# Patient Record
Sex: Female | Born: 1990 | Race: Black or African American | Hispanic: No | Marital: Single | Smoking: Former smoker
Health system: Southern US, Community
[De-identification: ages and names within clinical notes are randomized; demographics above are authoritative.]

## PROBLEM LIST (undated history)

## (undated) ENCOUNTER — Inpatient Hospital Stay (HOSPITAL_COMMUNITY): Payer: Self-pay

## (undated) DIAGNOSIS — Z789 Other specified health status: Secondary | ICD-10-CM

## (undated) HISTORY — PX: NO PAST SURGERIES: SHX2092

---
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
## 2011-02-17 LAB — URINALYSIS, ROUTINE W REFLEX MICROSCOPIC
Bilirubin Urine: NEGATIVE
Glucose, UA: NEGATIVE mg/dL
Hgb urine dipstick: NEGATIVE
Ketones, ur: NEGATIVE mg/dL
Nitrite: NEGATIVE
Protein, ur: NEGATIVE mg/dL
Specific Gravity, Urine: 1.018 (ref 1.005–1.030)
Urobilinogen, UA: 1 mg/dL (ref 0.0–1.0)
pH: 7.5 (ref 5.0–8.0)

## 2011-02-17 LAB — RPR: RPR Ser Ql: NONREACTIVE

## 2011-02-17 LAB — URINE MICROSCOPIC-ADD ON

## 2011-02-17 LAB — PREGNANCY, URINE: Preg Test, Ur: NEGATIVE

## 2011-02-17 LAB — HIV ANTIBODY (ROUTINE TESTING W REFLEX): HIV: NONREACTIVE

## 2011-02-21 LAB — URINALYSIS, ROUTINE W REFLEX MICROSCOPIC
Ketones, ur: NEGATIVE mg/dL
Nitrite: NEGATIVE
Protein, ur: NEGATIVE mg/dL
pH: 6 (ref 5.0–8.0)

## 2011-02-21 LAB — POCT PREGNANCY, URINE: Preg Test, Ur: NEGATIVE

## 2011-02-21 LAB — WET PREP, GENITAL
Trich, Wet Prep: NONE SEEN
Yeast Wet Prep HPF POC: NONE SEEN

## 2011-02-21 LAB — URINE MICROSCOPIC-ADD ON

## 2011-08-09 LAB — URINALYSIS, ROUTINE W REFLEX MICROSCOPIC
Ketones, ur: NEGATIVE
Nitrite: NEGATIVE
Protein, ur: NEGATIVE
Urobilinogen, UA: 0.2

## 2011-08-09 LAB — GC/CHLAMYDIA PROBE AMP, GENITAL
Chlamydia, DNA Probe: POSITIVE — AB
GC Probe Amp, Genital: NEGATIVE

## 2011-08-09 LAB — WET PREP, GENITAL
Trich, Wet Prep: NONE SEEN
Yeast Wet Prep HPF POC: NONE SEEN

## 2011-08-09 LAB — URINE MICROSCOPIC-ADD ON

## 2011-08-09 LAB — POCT PREGNANCY, URINE: Operator id: 29452

## 2011-08-22 LAB — DIFFERENTIAL
Basophils Relative: 0
Eosinophils Absolute: 0.1 — ABNORMAL LOW
Lymphs Abs: 1.7
Monocytes Absolute: 0.7
Neutro Abs: 7.2

## 2011-08-22 LAB — RAPID URINE DRUG SCREEN, HOSP PERFORMED
Amphetamines: NOT DETECTED
Barbiturates: NOT DETECTED
Opiates: NOT DETECTED

## 2011-08-22 LAB — BASIC METABOLIC PANEL
CO2: 26
Calcium: 8.9
Glucose, Bld: 91
Potassium: 4.2
Sodium: 140

## 2011-08-22 LAB — CBC
HCT: 36.2
Hemoglobin: 11.6 — ABNORMAL LOW
MCHC: 32.1
MCV: 69.7 — ABNORMAL LOW
RDW: 29.2 — ABNORMAL HIGH

## 2011-08-22 LAB — URINALYSIS, ROUTINE W REFLEX MICROSCOPIC
Glucose, UA: NEGATIVE
Ketones, ur: NEGATIVE
Leukocytes, UA: NEGATIVE
pH: 5.5

## 2015-06-08 ENCOUNTER — Encounter (HOSPITAL_COMMUNITY): Payer: Self-pay | Admitting: *Deleted

## 2015-06-08 ENCOUNTER — Inpatient Hospital Stay (HOSPITAL_COMMUNITY)
Admission: AD | Admit: 2015-06-08 | Discharge: 2015-06-08 | Disposition: A | Discharge status: Home / Self Care | Payer: Medicaid - Out of State | Source: Ambulatory Visit | Attending: Obstetrics and Gynecology | Admitting: Obstetrics and Gynecology

## 2015-06-08 DIAGNOSIS — Z3A38 38 weeks gestation of pregnancy: Secondary | ICD-10-CM | POA: Insufficient documentation

## 2015-06-08 DIAGNOSIS — Z3490 Encounter for supervision of normal pregnancy, unspecified, unspecified trimester: Secondary | ICD-10-CM

## 2015-06-08 LAB — DIFFERENTIAL
BASOS ABS: 0 10*3/uL (ref 0.0–0.1)
Basophils Relative: 0 % (ref 0–1)
EOS ABS: 0.1 10*3/uL (ref 0.0–0.7)
Eosinophils Relative: 1 % (ref 0–5)
LYMPHS ABS: 1.4 10*3/uL (ref 0.7–4.0)
Lymphocytes Relative: 17 % (ref 12–46)
Monocytes Absolute: 0.8 10*3/uL (ref 0.1–1.0)
Monocytes Relative: 9 % (ref 3–12)
Neutro Abs: 5.9 10*3/uL (ref 1.7–7.7)
Neutrophils Relative %: 73 % (ref 43–77)

## 2015-06-08 LAB — CBC
HEMATOCRIT: 35.4 % — AB (ref 36.0–46.0)
HEMOGLOBIN: 11.7 g/dL — AB (ref 12.0–15.0)
MCH: 26.5 pg (ref 26.0–34.0)
MCHC: 33.1 g/dL (ref 30.0–36.0)
MCV: 80.3 fL (ref 78.0–100.0)
Platelets: 164 10*3/uL (ref 150–400)
RBC: 4.41 MIL/uL (ref 3.87–5.11)
RDW: 13.9 % (ref 11.5–15.5)
WBC: 8.2 10*3/uL (ref 4.0–10.5)

## 2015-06-08 LAB — HEPATITIS B SURFACE ANTIGEN: Hepatitis B Surface Ag: NEGATIVE

## 2015-06-08 LAB — RAPID HIV SCREEN (HIV 1/2 AB+AG)
HIV 1/2 Antibodies: NONREACTIVE
HIV-1 P24 Antigen - HIV24: NONREACTIVE

## 2015-06-08 LAB — ABO/RH: ABO/RH(D): AB POS

## 2015-06-08 LAB — TYPE AND SCREEN
ABO/RH(D): AB POS
Antibody Screen: NEGATIVE

## 2015-06-08 NOTE — MAU Note (Signed)
Urine in lab 

## 2015-06-08 NOTE — MAU Provider Note (Signed)
Pt here for labor check 2/2 to irregular contractions and perineal pressure. Not in active labor. PNC received in [facility]; needing to est care in [facility]: Lambert Rhonda W. Appt made for 0940 on 7/27 and pt will bring all records from [facility]. Pt prenatal labs drawn for visit in am.  D. Lowanda Foster, MD  RN labor check. I agree with above. NST reactive.   [place], CNM 06/08/2015 2:28 PM

## 2015-06-08 NOTE — MAU Note (Signed)
Pain started in lower abd last night.  Comes and goes, is not regular.

## 2015-06-08 NOTE — MAU Note (Signed)
Pain started in lower abd last night.Comes and goes, irregular  Denies bright red vaginal bleeding.   Positive fetal movement Denies SROM/LOF Denies any infections/complications of pregnancy  GBS unknown per patient  Has prenatal records from [place] in [place] with her, but not with her today.

## 2015-06-08 NOTE — MAU Note (Signed)
Just moved here from [place].  Was getting care there. Has copy of records, but not with her

## 2015-06-08 NOTE — Discharge Instructions (Signed)
Braxton Hicks Contractions °Contractions of the uterus can occur throughout pregnancy. Contractions are not always a sign that you are in labor.  °WHAT ARE BRAXTON HICKS CONTRACTIONS?  °Contractions that occur before labor are called Braxton Hicks contractions, or false labor. Toward the end of pregnancy (32-34 weeks), these contractions can develop more often and may become more forceful. This is not true labor because these contractions do not result in opening (dilatation) and thinning of the cervix. They are sometimes difficult to tell apart from true labor because these contractions can be forceful and people have different pain tolerances. You should not feel embarrassed if you go to the hospital with false labor. Sometimes, the only way to tell if you are in true labor is for your health care provider to look for changes in the cervix. °If there are no prenatal problems or other health problems associated with the pregnancy, it is completely safe to be sent home with false labor and await the onset of true labor. °HOW CAN YOU TELL THE DIFFERENCE BETWEEN TRUE AND FALSE LABOR? °False Labor °· The contractions of false labor are usually shorter and not as hard as those of true labor.   °· The contractions are usually irregular.   °· The contractions are often felt in the front of the lower abdomen and in the groin.   °· The contractions may go away when you walk around or change positions while lying down.   °· The contractions get weaker and are shorter lasting as time goes on.   °· The contractions do not usually become progressively stronger, regular, and closer together as with true labor.   °True Labor °· Contractions in true labor last 30-70 seconds, become very regular, usually become more intense, and increase in frequency.   °· The contractions do not go away with walking.   °· The discomfort is usually felt in the top of the uterus and spreads to the lower abdomen and low back.   °· True labor can be  determined by your health care provider with an exam. This will show that the cervix is dilating and getting thinner.   °WHAT TO REMEMBER °· Keep up with your usual exercises and follow other instructions given by your health care provider.   °· Take medicines as directed by your health care provider.   °· Keep your regular prenatal appointments.   °· Eat and drink lightly if you think you are going into labor.   °· If Braxton Hicks contractions are making you uncomfortable:   °¨ Change your position from lying down or resting to walking, or from walking to resting.   °¨ Sit and rest in a tub of warm water.   °¨ Drink 2-3 glasses of water. Dehydration may cause these contractions.   °¨ Do slow and deep breathing several times an hour.   °WHEN SHOULD I SEEK IMMEDIATE MEDICAL CARE? °Seek immediate medical care if: °· Your contractions become stronger, more regular, and closer together.   °· You have fluid leaking or gushing from your vagina.   °· You have a fever.   °· You pass blood-tinged mucus.   °· You have vaginal bleeding.   °· You have continuous abdominal pain.   °· You have low back pain that you never had before.   °· You feel your baby's head pushing down and causing pelvic pressure.   °· Your baby is not moving as much as it used to.   °Document Released: 10/30/2005 Document Revised: 11/04/2013 Document Reviewed: 08/11/2013 °ExitCare® Patient Information ©2015 [employer]. This information is not intended to replace advice given to you by your health care   provider. Make sure you discuss any questions you have with your health care provider. ° °

## 2015-06-09 ENCOUNTER — Encounter: Payer: Medicaid - Out of State | Admitting: Certified Nurse Midwife

## 2015-06-09 LAB — HIV ANTIBODY (ROUTINE TESTING W REFLEX): HIV Screen 4th Generation wRfx: NONREACTIVE

## 2015-06-09 LAB — RPR: RPR Ser Ql: NONREACTIVE

## 2015-06-09 LAB — RUBELLA SCREEN: RUBELLA: 5 {index} (ref >0.99)

## 2015-06-12 LAB — CULTURE, BETA STREP (GROUP B ONLY)

## 2016-04-12 ENCOUNTER — Encounter (HOSPITAL_COMMUNITY): Payer: Self-pay | Admitting: *Deleted

## 2016-10-21 ENCOUNTER — Encounter (HOSPITAL_COMMUNITY): Payer: Self-pay | Admitting: *Deleted

## 2016-10-21 ENCOUNTER — Inpatient Hospital Stay (HOSPITAL_COMMUNITY)
Admission: AD | Admit: 2016-10-21 | Discharge: 2016-10-21 | Disposition: A | Discharge status: Home / Self Care | Payer: Medicaid Other | Source: Ambulatory Visit | Attending: Obstetrics & Gynecology | Admitting: Obstetrics & Gynecology

## 2016-10-21 DIAGNOSIS — Z3490 Encounter for supervision of normal pregnancy, unspecified, unspecified trimester: Secondary | ICD-10-CM | POA: Insufficient documentation

## 2016-10-21 DIAGNOSIS — Z3A00 Weeks of gestation of pregnancy not specified: Secondary | ICD-10-CM | POA: Insufficient documentation

## 2016-10-21 HISTORY — DX: Other specified health status: Z78.9

## 2016-10-21 NOTE — Discharge Instructions (Signed)

## 2016-10-21 NOTE — MAU Note (Signed)
C/o ucs all day yesterday and all nigh and today; ?SROM 2 days ago; denies any vaginal bleeding;

## 2016-10-21 NOTE — MAU Note (Signed)
States that she got [place] prenatal [place] in [place]; does not have any prenatal records with her; states that she thinks she should be induced and she wants an u/s;

## 2016-10-29 ENCOUNTER — Inpatient Hospital Stay (HOSPITAL_COMMUNITY): Payer: Medicaid Other

## 2016-10-29 ENCOUNTER — Encounter (HOSPITAL_COMMUNITY): Payer: Self-pay | Admitting: Certified Nurse Midwife

## 2016-10-29 ENCOUNTER — Inpatient Hospital Stay (HOSPITAL_COMMUNITY)
Admission: AD | Admit: 2016-10-29 | Discharge: 2016-10-29 | Disposition: A | Discharge status: Home / Self Care | Payer: Medicaid Other | Source: Ambulatory Visit | Attending: Obstetrics and Gynecology | Admitting: Obstetrics and Gynecology

## 2016-10-29 DIAGNOSIS — Z3A36 36 weeks gestation of pregnancy: Secondary | ICD-10-CM | POA: Insufficient documentation

## 2016-10-29 DIAGNOSIS — Z363 Encounter for antenatal screening for malformations: Secondary | ICD-10-CM | POA: Diagnosis present

## 2016-10-29 DIAGNOSIS — Z87891 Personal history of nicotine dependence: Secondary | ICD-10-CM | POA: Insufficient documentation

## 2016-10-29 DIAGNOSIS — O479 False labor, unspecified: Secondary | ICD-10-CM | POA: Diagnosis present

## 2016-10-29 DIAGNOSIS — O0930 Supervision of pregnancy with insufficient antenatal care, unspecified trimester: Secondary | ICD-10-CM

## 2016-10-29 DIAGNOSIS — O0933 Supervision of pregnancy with insufficient antenatal care, third trimester: Secondary | ICD-10-CM | POA: Insufficient documentation

## 2016-10-29 DIAGNOSIS — Z3687 Encounter for antenatal screening for uncertain dates: Secondary | ICD-10-CM

## 2016-10-29 LAB — URINALYSIS, ROUTINE W REFLEX MICROSCOPIC
Bacteria, UA: NONE SEEN
Bilirubin Urine: NEGATIVE
GLUCOSE, UA: NEGATIVE mg/dL
Hgb urine dipstick: NEGATIVE
Ketones, ur: NEGATIVE mg/dL
Nitrite: NEGATIVE
PROTEIN: NEGATIVE mg/dL
SPECIFIC GRAVITY, URINE: 1.01 (ref 1.005–1.030)
pH: 7 (ref 5.0–8.0)

## 2016-10-29 LAB — RAPID URINE DRUG SCREEN, HOSP PERFORMED
AMPHETAMINES: NOT DETECTED
BENZODIAZEPINES: NOT DETECTED
Barbiturates: NOT DETECTED
Cocaine: NOT DETECTED
Opiates: NOT DETECTED
Tetrahydrocannabinol: NOT DETECTED

## 2016-10-29 MED ORDER — ZOLPIDEM TARTRATE 5 MG PO TABS
5.0000 mg | ORAL_TABLET | Freq: Once | ORAL | Status: AC
  Administered 2016-10-29: 5 mg via ORAL
  Filled 2016-10-29: qty 1

## 2016-10-29 MED ORDER — OXYCODONE-ACETAMINOPHEN 5-325 MG PO TABS
1.0000 | ORAL_TABLET | Freq: Once | ORAL | Status: AC
  Administered 2016-10-29: 1 via ORAL
  Filled 2016-10-29: qty 1

## 2016-10-29 NOTE — MAU Note (Signed)
Patient presents to MAU via EMS with complaints of contractions. Patient states contractions began yesterday and have gotten worse since then. She states contractions are every 2-3 minutes. Denies bleeding or leaking of fluid

## 2016-10-29 NOTE — Discharge Instructions (Signed)
Braxton Hicks Contractions °Contractions of the uterus can occur throughout pregnancy. Contractions are not always a sign that you are in labor.  °WHAT ARE BRAXTON HICKS CONTRACTIONS?  °Contractions that occur before labor are called Braxton Hicks contractions, or false labor. Toward the end of pregnancy (32-34 weeks), these contractions can develop more often and may become more forceful. This is not true labor because these contractions do not result in opening (dilatation) and thinning of the cervix. They are sometimes difficult to tell apart from true labor because these contractions can be forceful and people have different pain tolerances. You should not feel embarrassed if you go to the hospital with false labor. Sometimes, the only way to tell if you are in true labor is for your health care provider to look for changes in the cervix. °If there are no prenatal problems or other health problems associated with the pregnancy, it is completely safe to be sent home with false labor and await the onset of true labor. °HOW CAN YOU TELL THE DIFFERENCE BETWEEN TRUE AND FALSE LABOR? °False Labor  °· The contractions of false labor are usually shorter and not as hard as those of true labor.   °· The contractions are usually irregular.   °· The contractions are often felt in the front of the lower abdomen and in the groin.   °· The contractions may go away when you walk around or change positions while lying down.   °· The contractions get weaker and are shorter lasting as time goes on.   °· The contractions do not usually become progressively stronger, regular, and closer together as with true labor.   °True Labor  °· Contractions in true labor last 30-70 seconds, become very regular, usually become more intense, and increase in frequency.   °· The contractions do not go away with walking.   °· The discomfort is usually felt in the top of the uterus and spreads to the lower abdomen and low back.   °· True labor can be  determined by your health care provider with an exam. This will show that the cervix is dilating and getting thinner.   °WHAT TO REMEMBER °· Keep up with your usual exercises and follow other instructions given by your health care provider.   °· Take medicines as directed by your health care provider.   °· Keep your regular prenatal appointments.   °· Eat and drink lightly if you think you are going into labor.   °· If Braxton Hicks contractions are making you uncomfortable:   °¨ Change your position from lying down or resting to walking, or from walking to resting.   °¨ Sit and rest in a tub of warm water.   °¨ Drink 2-3 glasses of water. Dehydration may cause these contractions.   °¨ Do slow and deep breathing several times an hour.   °WHEN SHOULD I SEEK IMMEDIATE MEDICAL CARE? °Seek immediate medical care if: °· Your contractions become stronger, more regular, and closer together.   °· You have fluid leaking or gushing from your vagina.   °· You have a fever.   °· You pass blood-tinged mucus.   °· You have vaginal bleeding.   °· You have continuous abdominal pain.   °· You have low back pain that you never had before.   °· You feel your baby's head pushing down and causing pelvic pressure.   °· Your baby is not moving as much as it used to.   °This information is not intended to replace advice given to you by your health care provider. Make sure you discuss any questions you have with your health care   provider. °Document Released: 10/30/2005 Document Revised: 02/21/2016 Document Reviewed: 08/11/2013 °Elsevier Interactive Patient Education © 2017 Elsevier Inc. ° °

## 2016-10-29 NOTE — MAU Provider Note (Signed)
History     CSN: [id number]  Arrival date and time: 10/29/16 2007   First Provider Initiated Contact with Patient 10/29/16 2158      Chief Complaint  Patient presents with  . Contractions   Patient is a 25 y/o G3P2 female at 42+2 by LMP who receives her prenatal care in [place], but is here visiting family. She presents to MAU due to contractions and possible LOF. Does not report gush of fluid, but says she "may be leaking."     OB History    Gravida Para Term Preterm AB Living   3 2 2     2    SAB TAB Ectopic Multiple Live Births           2      Past Medical History:  Diagnosis Date  . Medical history non-contributory     Past Surgical History:  Procedure Laterality Date  . NO PAST SURGERIES      History reviewed. No pertinent family history.  Social History  Substance Use Topics  . Smoking status: Former Smoker    Quit date: 06/07/2012  . Smokeless tobacco: Former NeurosurgeonUser  . Alcohol use No    Allergies: No Known Allergies  Prescriptions Prior to Admission  Medication Sig Dispense Refill Last Dose  . Prenatal Vit-Fe Fumarate-FA (PRENATAL MULTIVITAMIN) TABS tablet Take 1 tablet by mouth daily at 12 noon.    10/21/2016 at Unknown time    ROS Physical Exam   Blood pressure 127/82, pulse 89, temperature 98.1 F (36.7 C), temperature source Oral, resp. rate 16, unknown if currently breastfeeding.  Physical Exam  Constitutional: She is oriented to person, place, and time. She appears well-developed and well-nourished. No distress.  Cardiovascular: Normal rate.   Respiratory: Effort normal.  Genitourinary: Vaginal discharge found.  Genitourinary Comments: SVE: 3/ thick/posterior  Musculoskeletal: Normal range of motion.  Neurological: She is alert and oriented to person, place, and time.  Skin: Skin is warm and dry.  Psychiatric: She has a normal mood and affect. Her behavior is normal. Thought content normal.   U/S: 36wk, ceph, AFI 15cm, EFW 69%  MAU  Course  Procedures  US consistent with [redacted]wk gestation Fern test negative  MDM   Assessment and Plan  Patient is a 25 y/o G3P2 female at 42+2 by LMP but 36 weeks by ultrasound. Cervical exam is unchanged, and fern test is negative. Will perform repeat cervical check in 1 hr, and if it remains unchanged, will discharge patient. Plan to get prenatal records, and GBS status in case patient delivers here.  Clearance Cootsndrew Tyson 10/29/2016, 10:05 PM   CNM attestation:  I have seen and examined this patient; I agree with above documentation in the resident's note.   Alexis Woodward is a 25 y.o. [id number] reporting ctx +FM, denies, VB, contractions, vaginal discharge.  PE: BP 127/82 (BP Location: Left Arm)   Pulse 89   Temp 98.1 F (36.7 C) (Oral)   Resp 16  Gen: calm comfortable, NAD Resp: normal effort, no distress Abd: gravid  ROS, labs, PMH reviewed NST reactive 150s, +accels, no decels Initially w/ irreg ctx q 3-7 mins; very spaced out by the end of the visit  Plan: On further discussion, we were unable to get a location/physican where pt was receiving PNC in [place]. She wasn't aware of a due date. Pt evasive when I asked how long she would be in [place]. Was also evasive about the location of her two children.  -  labor precautions rev'd - GBS/GC/chlam and UDS collected - [facility] if pt plans to stay in [place] - given Ambien and Percocet PO prior to d/c  Alexis Woodward, Alexis Woodward, CNM 10:37 PM  10/29/2016

## 2016-11-01 LAB — CULTURE, BETA STREP (GROUP B ONLY)

## 2016-11-15 ENCOUNTER — Encounter: Payer: Medicaid - Out of State | Admitting: Advanced Practice Midwife

## 2017-08-26 ENCOUNTER — Encounter (HOSPITAL_COMMUNITY): Payer: Self-pay

## 2017-12-09 IMAGING — US US MFM OB COMP +14 WKS
1 series · 14 of 28 positions shown · non-contrast
Comparison: none

[Series 1: us mfm ob comp +14 wks · 55 acquisitions, 14 frames shown]
[im 3/55]
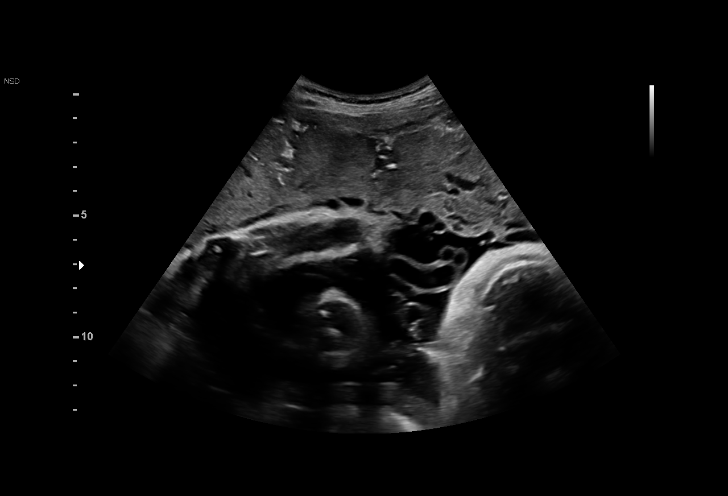
[im 7/55]
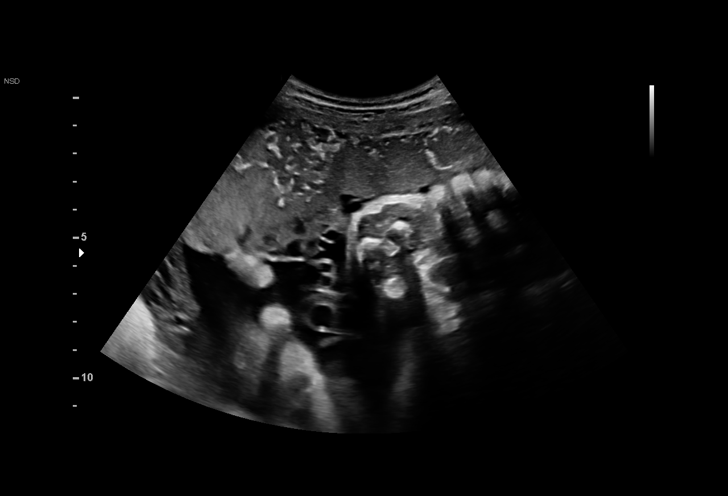
[im 11/55]
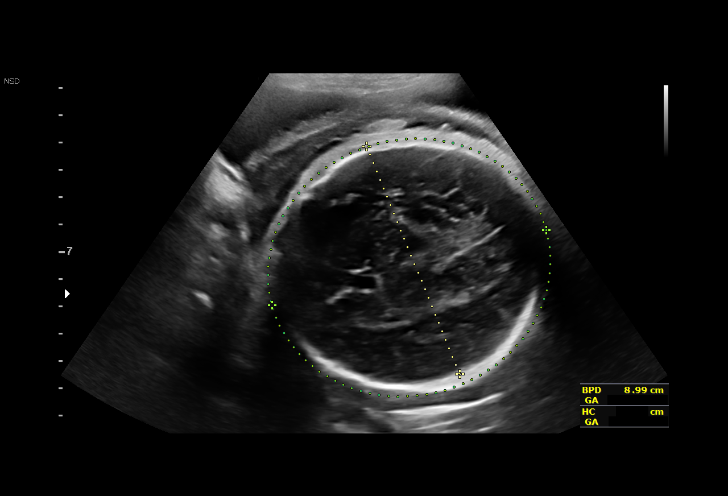
[im 15/55]
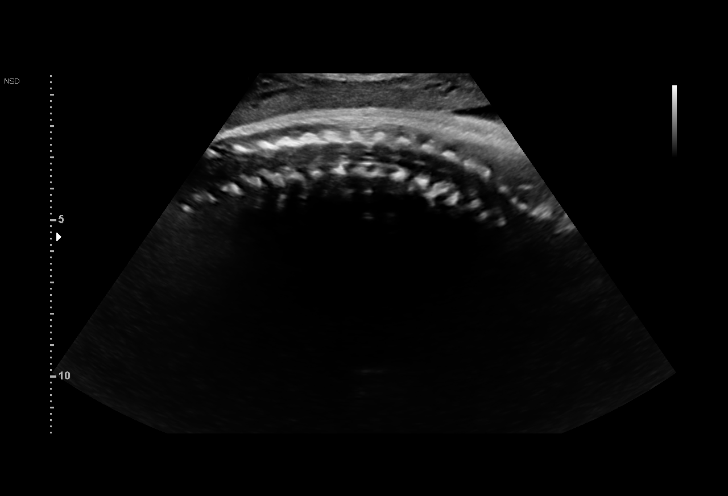
[im 19/55]
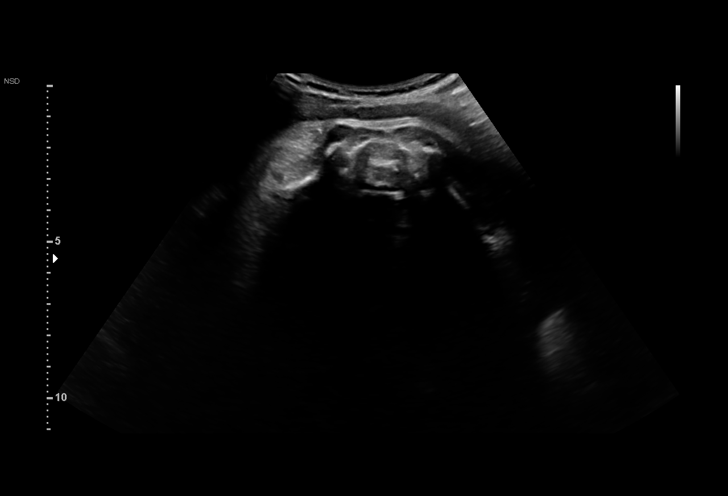
[im 23/55]
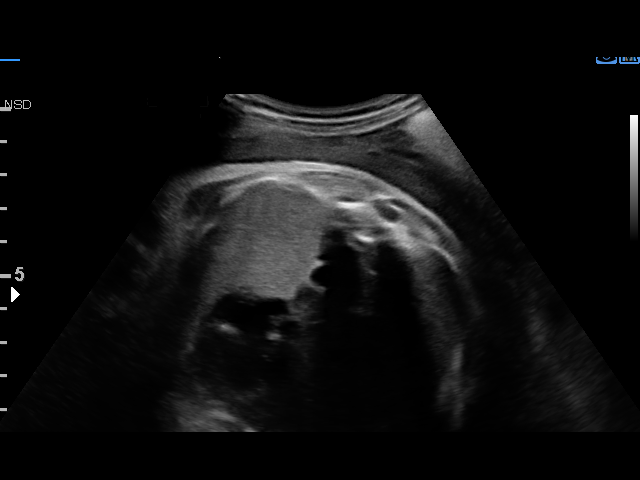
[im 27/55]
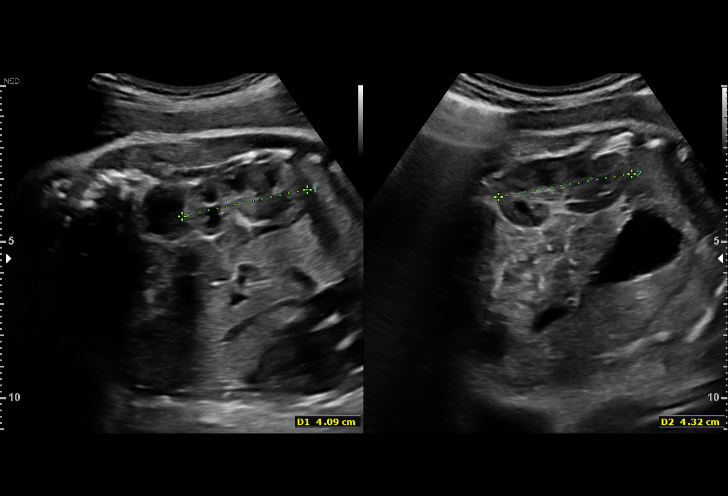
[im 31/55]
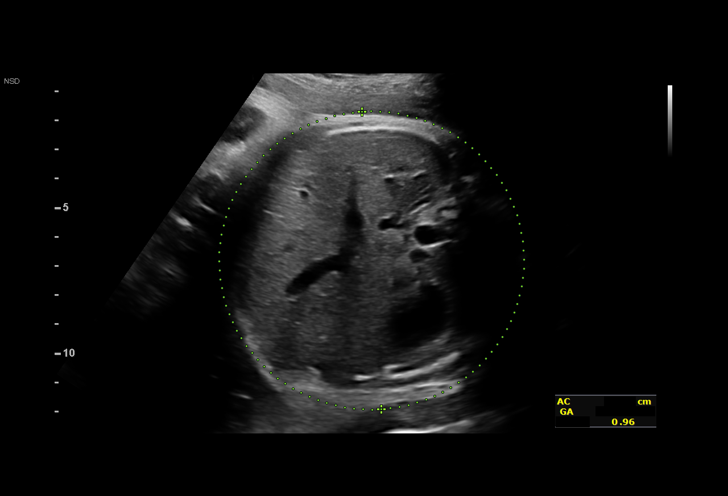
[im 35/55]
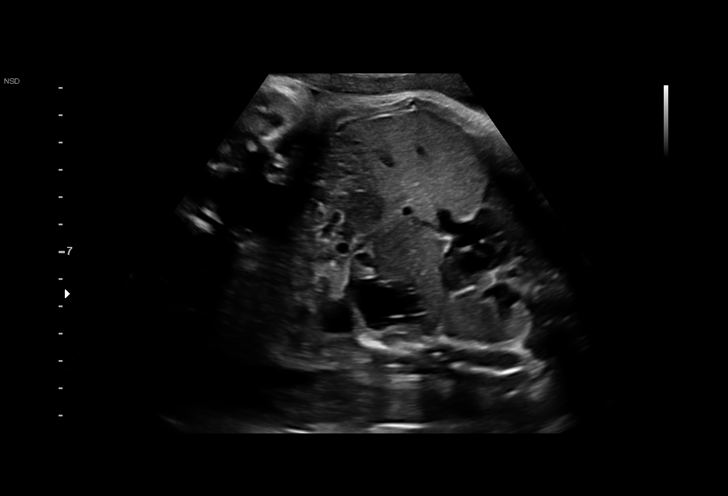
[im 39/55]
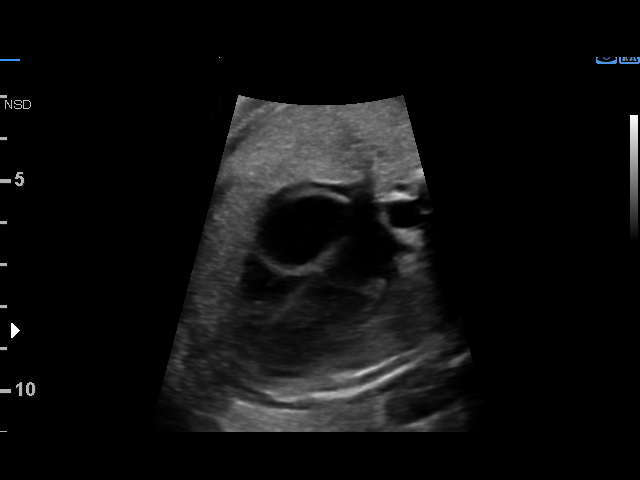
[im 43/55]
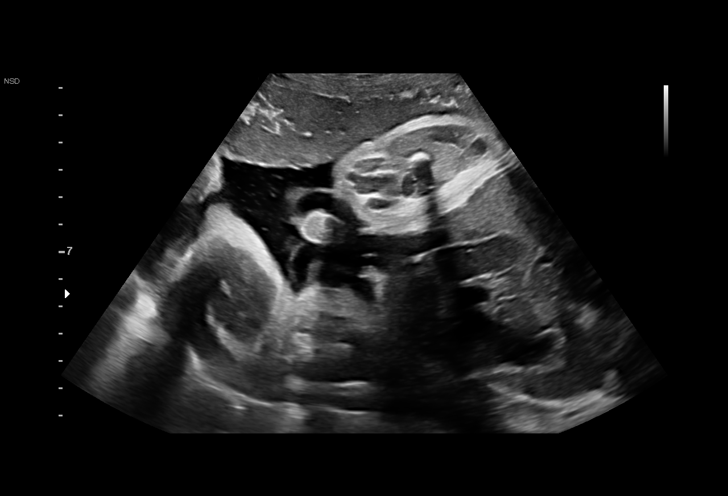
[im 47/55]
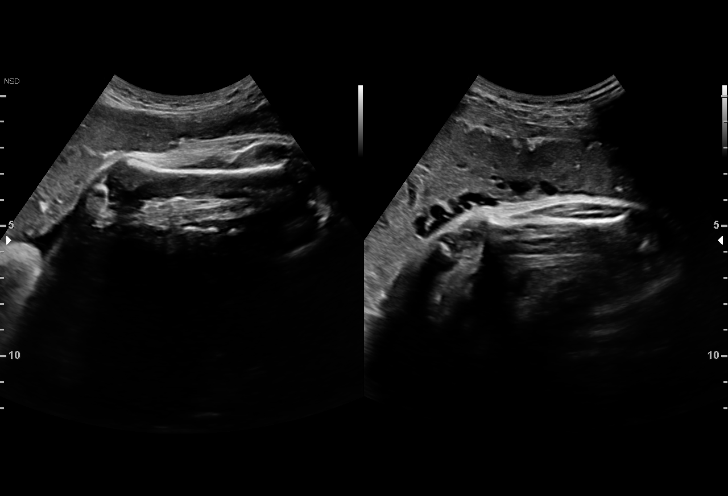
[im 51/55]
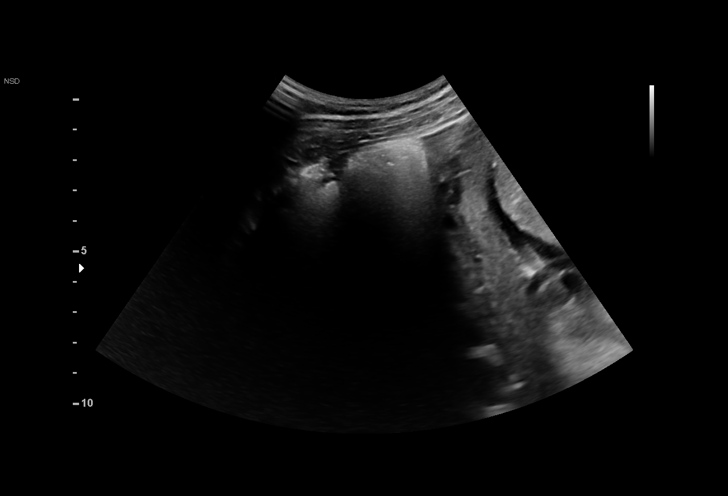
[im 55/55]
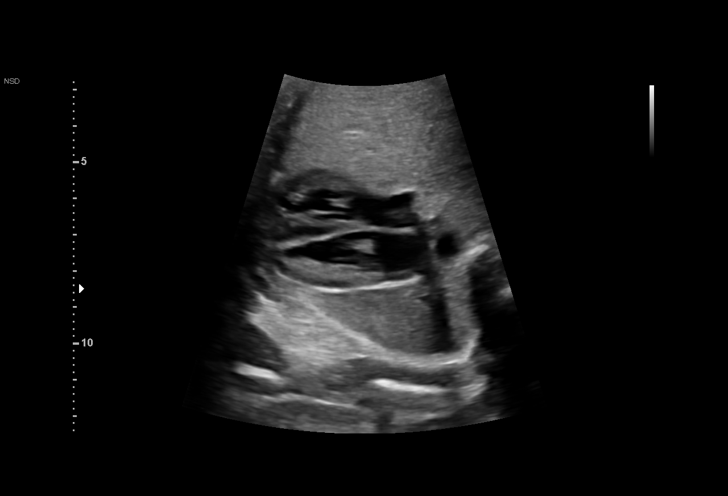

[14 of 28 positions shown; findings below may reference images not displayed]

Attending:        Galuska Iles       Secondary Phy.:   TUAIRUA TORRES MUATJITJEJA Nursing-
MAU/Triage

1  SEGUNDO J SAGASTUME            [id number]      [id number]     [id number]
Indications

36 weeks gestation of pregnancy
Insufficient Prenatal Care (None)
Encounter for uncertain dates
Encounter for antenatal screening for
malformations
Abdominal pain in pregnancy (Contractions)
OB History

Gravidity:    3         Term:   2        Prem:   0        SAB:   0
TOP:          0       Ectopic:  0        Living: 2
Fetal Evaluation

Num Of Fetuses:     1
Fetal Heart         132
Rate(bpm):
Cardiac Activity:   Observed
Presentation:       Cephalic
Placenta:           Anterior, above cervical os
P. Cord Insertion:  Visualized

Amniotic Fluid
AFI FV:      Subjectively within normal limits

AFI Sum(cm)     %Tile       Largest Pocket(cm)
15.62           57

RUQ(cm)       RLQ(cm)       LUQ(cm)        LLQ(cm)
4.93
Biometry

BPD:      90.2  mm     G. Age:  36w 4d         73  %    CI:        83.16   %   70 - 86
FL/HC:      22.6   %   20.1 -
HC:      311.9  mm     G. Age:  34w 6d          6  %    HC/AC:      0.96       0.93 -
AC:      325.4  mm     G. Age:  36w 3d         72  %    FL/BPD:     78.0   %   71 - 87
FL:       70.4  mm     G. Age:  36w 1d         48  %    FL/AC:      21.6   %   20 - 24
HUM:      61.1  mm     G. Age:  35w 3d         51  %

Est. FW:    9797  gm      6 lb 5 oz     69  %
Gestational Age

Clinical EDD:  42w 2d                                        EDD:   10/13/16
U/S Today:     36w 0d                                        EDD:   11/26/16
Best:          36w 0d    Det. By:   U/S (10/29/16)           EDD:   11/26/16
Anatomy

Cranium:               Not well visualized    Aortic Arch:            Not well visualized
Cavum:                 Not well visualized    Ductal Arch:            Not well visualized
Ventricles:            Not well visualized    Diaphragm:              Appears normal
Choroid Plexus:        Appears normal         Stomach:                Appears normal, left
sided
Cerebellum:            Not well visualized    Abdomen:                Appears normal
Posterior Fossa:       Not well visualized    Abdominal Wall:         Appears nml (cord
insert, abd wall)
Nuchal Fold:           Not applicable (>20    Cord Vessels:           Appears normal (3
wks GA)                                        vessel cord)
Face:                  Not well visualized    Kidneys:                Appear normal
Lips:                  Not well visualized    Bladder:                Appears normal
Palate:                Not well visualized    Spine:                  Not well visualized
Heart:                 Appears normal         Upper Extremities:      Present
(4CH, axis, and situs
RVOT:                  Appears normal         Lower Extremities:      Present
LVOT:                  Appears normal

Other:  Fetus appears to be a male. Technically difficult due to advanced GA
and fetal position.
Cervix Uterus Adnexa

Cervix
Not visualized (advanced GA >16wks)

Left Ovary
Not visualized.

Right Ovary
Not visualized.

Adnexa:       No abnormality visualized.
Impression

SIUP at 49w5d
active singleton fetus
EFW 69th%'le
no dysmorphic features
limitations as above
AFI is normal
no previa
Recommendations

Follow up ultrasounds as clinically indicated.

## 2023-03-09 ENCOUNTER — Other Ambulatory Visit: Payer: Self-pay

## 2023-03-09 ENCOUNTER — Emergency Department (HOSPITAL_COMMUNITY)
Admission: EM | Admit: 2023-03-09 | Discharge: 2023-03-09 | Disposition: A | Discharge status: Home / Self Care | Payer: Medicaid - Out of State | Attending: Emergency Medicine | Admitting: Emergency Medicine

## 2023-03-09 ENCOUNTER — Encounter (HOSPITAL_COMMUNITY): Payer: Self-pay

## 2023-03-09 ENCOUNTER — Emergency Department (HOSPITAL_COMMUNITY): Payer: Medicaid - Out of State

## 2023-03-09 DIAGNOSIS — Z23 Encounter for immunization: Secondary | ICD-10-CM | POA: Diagnosis not present

## 2023-03-09 DIAGNOSIS — M79642 Pain in left hand: Secondary | ICD-10-CM | POA: Diagnosis present

## 2023-03-09 MED ORDER — AMOXICILLIN-POT CLAVULANATE 875-125 MG PO TABS
1.0000 | ORAL_TABLET | Freq: Once | ORAL | Status: AC
  Administered 2023-03-09: 1 via ORAL
  Filled 2023-03-09: qty 1

## 2023-03-09 MED ORDER — TETANUS-DIPHTH-ACELL PERTUSSIS 5-2.5-18.5 LF-MCG/0.5 IM SUSY
0.5000 mL | PREFILLED_SYRINGE | Freq: Once | INTRAMUSCULAR | Status: AC
  Administered 2023-03-09: 0.5 mL via INTRAMUSCULAR
  Filled 2023-03-09: qty 0.5

## 2023-03-09 MED ORDER — AMOXICILLIN-POT CLAVULANATE 875-125 MG PO TABS: 1.0000 | ORAL_TABLET | Freq: Two times a day (BID) | ORAL | 0 refills | Status: AC

## 2023-03-09 MED ORDER — ACETAMINOPHEN 325 MG PO TABS
650.0000 mg | ORAL_TABLET | Freq: Once | ORAL | Status: AC
  Administered 2023-03-09: 650 mg via ORAL
  Filled 2023-03-09: qty 2
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
## 2024-11-02 ENCOUNTER — Emergency Department (HOSPITAL_COMMUNITY): Payer: Self-pay

## 2024-11-02 ENCOUNTER — Encounter (HOSPITAL_COMMUNITY): Payer: Self-pay

## 2024-11-02 ENCOUNTER — Other Ambulatory Visit: Payer: Self-pay

## 2024-11-02 ENCOUNTER — Emergency Department (HOSPITAL_COMMUNITY)
Admission: EM | Admit: 2024-11-02 | Discharge: 2024-11-03 | Disposition: A | Payer: Self-pay | Attending: Emergency Medicine | Admitting: Emergency Medicine

## 2024-11-02 DIAGNOSIS — K859 Acute pancreatitis without necrosis or infection, unspecified: Secondary | ICD-10-CM | POA: Insufficient documentation

## 2024-11-02 DIAGNOSIS — D649 Anemia, unspecified: Secondary | ICD-10-CM | POA: Insufficient documentation

## 2024-11-02 DIAGNOSIS — R748 Abnormal levels of other serum enzymes: Secondary | ICD-10-CM | POA: Insufficient documentation

## 2024-11-02 DIAGNOSIS — R002 Palpitations: Secondary | ICD-10-CM | POA: Insufficient documentation

## 2024-11-02 LAB — BASIC METABOLIC PANEL WITH GFR
Anion gap: 10 (ref 5–15)
BUN: 11 mg/dL (ref 6–20)
CO2: 23 mmol/L (ref 22–32)
Calcium: 8.7 mg/dL — ABNORMAL LOW (ref 8.9–10.3)
Chloride: 105 mmol/L (ref 98–111)
Creatinine, Ser: 0.76 mg/dL (ref 0.44–1.00)
GFR, Estimated: >60 mL/min
Glucose, Bld: 99 mg/dL (ref 70–99)
Potassium: 3.8 mmol/L (ref 3.5–5.1)
Sodium: 138 mmol/L (ref 135–145)

## 2024-11-02 LAB — TROPONIN T, HIGH SENSITIVITY: Troponin T High Sensitivity: <15 ng/L (ref 0–19)

## 2024-11-02 LAB — CBC
HCT: 35.5 % — ABNORMAL LOW (ref 36.0–46.0)
Hemoglobin: 11.2 g/dL — ABNORMAL LOW (ref 12.0–15.0)
MCH: 24.4 pg — ABNORMAL LOW (ref 26.0–34.0)
MCHC: 31.5 g/dL (ref 30.0–36.0)
MCV: 77.3 fL — ABNORMAL LOW (ref 80.0–100.0)
Platelets: 308 K/uL (ref 150–400)
RBC: 4.59 MIL/uL (ref 3.87–5.11)
RDW: 16.1 % — ABNORMAL HIGH (ref 11.5–15.5)
WBC: 7.6 K/uL (ref 4.0–10.5)
nRBC: 0 % (ref 0.0–0.2)

## 2024-11-02 LAB — HEPATIC FUNCTION PANEL
ALT: 11 U/L (ref 0–44)
AST: 21 U/L (ref 15–41)
Albumin: 4 g/dL (ref 3.5–5.0)
Alkaline Phosphatase: 81 U/L (ref 38–126)
Bilirubin, Direct: <0.1 mg/dL (ref 0.0–0.2)
Total Bilirubin: 0.2 mg/dL (ref 0.0–1.2)
Total Protein: 7.4 g/dL (ref 6.5–8.1)

## 2024-11-02 LAB — HCG, SERUM, QUALITATIVE: Preg, Serum: NEGATIVE

## 2024-11-02 LAB — LIPASE, BLOOD: Lipase: 156 U/L — ABNORMAL HIGH (ref 11–51)

## 2024-11-02 NOTE — ED Triage Notes (Signed)
 Patient reports pain in her left chest that feels like its jumping. States the pain is also around her left breast. The pain began 1 week ago. Rates pain 8/10. No blood thinners. No other medications per patient.

## 2024-11-02 NOTE — ED Provider Notes (Incomplete)
" °  [place] EMERGENCY DEPARTMENT AT [facility] Provider Note   CSN: [id number] Arrival date & time: 11/02/24  2134     Patient presents with: Chest Pain   Alexis Woodward is a 33 y.o. female.  {Add pertinent medical, surgical, social history, OB history to HPI:32947} HPI     Prior to Admission medications  Medication Sig Start Date End Date Taking? Authorizing Provider  Prenatal Vit-Fe Fumarate-FA (PRENATAL MULTIVITAMIN) TABS tablet Take 1 tablet by mouth daily at 12 noon.     [provider]    Allergies: Patient has no known allergies.    Review of Systems  Updated Vital Signs BP 109/71   Pulse 61   Temp 97.9 F (36.6 C)   Resp 18   Ht 5' 3 (1.6 m)   Wt 56.7 kg   LMP 09/26/2024 (Exact Date)   SpO2 100%   BMI 22.14 kg/m   Physical Exam  (all labs ordered are listed, but only abnormal results are displayed) Labs Reviewed  BASIC METABOLIC PANEL WITH GFR - Abnormal; Notable for the following components:      Result Value   Calcium 8.7 (*)    All other components within normal limits  CBC - Abnormal; Notable for the following components:   Hemoglobin 11.2 (*)    HCT 35.5 (*)    MCV 77.3 (*)    MCH 24.4 (*)    RDW 16.1 (*)    All other components within normal limits  LIPASE, BLOOD - Abnormal; Notable for the following components:   Lipase 156 (*)    All other components within normal limits  HCG, SERUM, QUALITATIVE  HEPATIC FUNCTION PANEL  TSH  TROPONIN T, HIGH SENSITIVITY  TROPONIN T, HIGH SENSITIVITY    EKG: EKG Interpretation Date/Time:  Sunday November 02 2024 21:54:37 EST Ventricular Rate:  64 PR Interval:  134 QRS Duration:  88 QT Interval:  384 QTC Calculation: 396 R Axis:   83  Text Interpretation: Normal sinus rhythm with sinus arrhythmia Normal ECG No previous ECGs available Confirmed by Tonia Chew [phone]) on 11/02/2024 10:00:32 PM  Radiology: DG Chest 2 View Result Date: 11/02/2024 CLINICAL DATA:  Chest  pain EXAM: CHEST - 2 VIEW COMPARISON:  None Available. FINDINGS: The heart size and mediastinal contours are within normal limits. Both lungs are clear. The visualized skeletal structures are unremarkable. IMPRESSION: No active cardiopulmonary disease. Electronically Signed   By: Oneil Devonshire M.D.   On: 11/02/2024 22:37    {Document cardiac monitor, telemetry assessment procedure when appropriate:32947} Procedures   Medications Ordered in the ED - No data to display    {Click here for ABCD2, HEART and other calculators REFRESH Note before signing:1}                              Medical Decision Making Amount and/or Complexity of Data Reviewed Labs: ordered. Radiology: ordered.   ***  {Document critical care time when appropriate  Document review of labs and clinical decision tools ie CHADS2VASC2, etc  Document your independent review of radiology images and any outside records  Document your discussion with family members, caretakers and with consultants  Document social determinants of health affecting pt's care  Document your decision making why or why not admission, treatments were needed:32947:::1}   Final diagnoses:  None    ED Discharge Orders     None        "

## 2024-11-02 NOTE — ED Provider Notes (Signed)
 " [place] EMERGENCY DEPARTMENT AT [facility] Provider Note   CSN: [id number] Arrival date & time: 11/02/24  2134     Patient presents with: Chest Pain   Alexis Woodward is a 33 y.o. female who presents with concern for palpitations and intermittent sharp pain in the left chest that shoots into the axilla.  Started about a week ago, patient is unable to provide a frequency to the episodes but states that they are intermittent and spontaneously resolved after few seconds.  No anticoagulation no, no history of thyroid  anomalies in the past, no history of dysrhythmias, no family history of sudden cardiac death.  History of 3 pregnancies in the past, denies any other medical history.   HPI     Prior to Admission medications  Medication Sig Start Date End Date Taking? Authorizing Provider  Prenatal Vit-Fe Fumarate-FA (PRENATAL MULTIVITAMIN) TABS tablet Take 1 tablet by mouth daily at 12 noon.     [provider]    Allergies: Patient has no known allergies.    Review of Systems  Constitutional: Negative.   HENT: Negative.    Respiratory: Negative.    Cardiovascular:  Positive for chest pain and palpitations.  Gastrointestinal: Negative.     Updated Vital Signs BP (!) 99/59   Pulse 70   Temp 98 F (36.7 C) (Oral)   Resp 17   Ht 5' 3 (1.6 m)   Wt 56.7 kg   LMP 09/26/2024 (Exact Date)   SpO2 100%   BMI 22.14 kg/m   Physical Exam Vitals and nursing note reviewed. Exam conducted with a chaperone present.  Constitutional:      Appearance: She is not ill-appearing or toxic-appearing.  HENT:     Head: Normocephalic and atraumatic.     Mouth/Throat:     Mouth: Mucous membranes are moist.     Pharynx: No oropharyngeal exudate or posterior oropharyngeal erythema.  Eyes:     General:        Right eye: No discharge.        Left eye: No discharge.     Conjunctiva/sclera: Conjunctivae normal.  Cardiovascular:     Rate and Rhythm: Normal rate and regular  rhythm.     Pulses: Normal pulses.     Heart sounds: Normal heart sounds. No murmur heard. Pulmonary:     Effort: Pulmonary effort is normal. No respiratory distress.     Breath sounds: Normal breath sounds. No wheezing or rales.  Chest:     Chest wall: No mass, lacerations, deformity, swelling, tenderness, crepitus or edema.  Breasts:    Right: Normal.     Left: Normal.     Comments: No masses, skin changes, or lymphadenopathy in the left chest or axilla. Abdominal:     General: Bowel sounds are normal. There is no distension.     Palpations: Abdomen is soft.     Tenderness: There is no abdominal tenderness.  Musculoskeletal:        General: No deformity.     Cervical back: Neck supple.     Right lower leg: No edema.     Left lower leg: No edema.  Skin:    General: Skin is warm and dry.     Capillary Refill: Capillary refill takes less than 2 seconds.  Neurological:     General: No focal deficit present.     Mental Status: She is alert and oriented to person, place, and time. Mental status is at baseline.  Psychiatric:  Mood and Affect: Mood normal.     (all labs ordered are listed, but only abnormal results are displayed) Labs Reviewed  BASIC METABOLIC PANEL WITH GFR - Abnormal; Notable for the following components:      Result Value   Calcium 8.7 (*)    All other components within normal limits  CBC - Abnormal; Notable for the following components:   Hemoglobin 11.2 (*)    HCT 35.5 (*)    MCV 77.3 (*)    MCH 24.4 (*)    RDW 16.1 (*)    All other components within normal limits  LIPASE, BLOOD - Abnormal; Notable for the following components:   Lipase 156 (*)    All other components within normal limits  HCG, SERUM, QUALITATIVE  HEPATIC FUNCTION PANEL  TSH  TROPONIN T, HIGH SENSITIVITY  TROPONIN T, HIGH SENSITIVITY    EKG: EKG Interpretation Date/Time:  Sunday November 02 2024 21:54:37 EST Ventricular Rate:  64 PR Interval:  134 QRS Duration:  88 QT  Interval:  384 QTC Calculation: 396 R Axis:   83  Text Interpretation: Normal sinus rhythm with sinus arrhythmia Normal ECG No previous ECGs available Confirmed by Tonia Chew [phone]) on 11/02/2024 10:00:32 PM  Radiology: DG Chest 2 View Result Date: 11/02/2024 CLINICAL DATA:  Chest pain EXAM: CHEST - 2 VIEW COMPARISON:  None Available. FINDINGS: The heart size and mediastinal contours are within normal limits. Both lungs are clear. The visualized skeletal structures are unremarkable. IMPRESSION: No active cardiopulmonary disease. Electronically Signed   By: Oneil Devonshire M.D.   On: 11/02/2024 22:37     Procedures   Medications Ordered in the ED - No data to display                                  Medical Decision Making 33 year old female presents palpitations and intermittent left chest pain.  Vitals on normal intake.  Cardiopulmonary exam unremarkable, abdominal exam is benign. No LE edema.   DDx includes but is not limited to dysrhythmia, psychosomatic presentation, drug effect, PE, thyroid  anomaly, metabolic derangement.  Amount and/or Complexity of Data Reviewed Labs: ordered.    Details: CBC with mild anemia hemoglobin 11, BMP unremarkable, hepatic function panel unremarkable, lipase mildly elevated to 156.  TSH is normal at 1.6, pregnancy test is negative, tropes negative x 2. Radiology: ordered.    Details: Chest x-ray negative for acute cardiopulmonary disease.  ECG/medicine tests:     Details:  EKG as above.   No evidence of ACS on workup today. Clinical concern for emergent underlying etiology of this patient's symptoms that would warrant further ED workup or inpatient management is exceedingly low.   Will refer to OP cardiology for follow up, return precautions given.   Alexis Woodward voiced understanding of her medical evaluation and treatment plan. Each of their questions answered to their expressed satisfaction.  Return precautions were given.  Patient is  well-appearing, stable, and was discharged in good condition.  This chart was dictated using voice recognition software, Dragon. Despite the best efforts of this provider to proofread and correct errors, errors may still occur which can change documentation meaning.      Final diagnoses:  Palpitations  Acute pancreatitis, unspecified complication status, unspecified pancreatitis type    ED Discharge Orders          Ordered    Ambulatory referral to Cardiology       Comments:  If you have not heard from the Cardiology office within the next 72 hours please call [phone].   11/03/24 0052               Shadell Brenn, [place] SAUNDERS, PA-C 11/03/24 0120    Tonia Chew, MD 11/03/24 2303  "

## 2024-11-03 LAB — TROPONIN T, HIGH SENSITIVITY: Troponin T High Sensitivity: <15 ng/L (ref 0–19)

## 2024-11-03 LAB — TSH: TSH: 1.63 u[IU]/mL (ref 0.350–4.500)

## 2024-11-03 NOTE — Discharge Instructions (Addendum)
 You were seen in the ER today for your palpitations and left chest pain.  Your physical exam and workup are very reassuring.  Please increase your hydration. Please follow-up with a cardiologist listed below for further evaluation.  Return to the ER with any severe symptoms.
